# Patient Record
Sex: Female | Born: 1990 | Race: White | Hispanic: No | Marital: Single
Health system: Southern US, Community
[De-identification: ages and names within clinical notes are randomized; demographics above are authoritative.]

---
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
## 2014-05-15 NOTE — H&P (Signed)
L&D Evaluation:  History:   HPI 24 year old WF G1 P[facility]  [facility] 7wk 5 day ultrasound presents with contractions kept overnight for prolonged rule out and made change from 2cm to 4cm. Notes positive fetal movement, denies leakage of fluid and contractions. [facility] in the first trimester. PNC remarkable for hx of anxiety/depression and bulemia and is taking Zoloft 50 mgm daily.    Presents with contractions    Patient's Medical History anxiety/depression, bulemia    Patient's Surgical History none    Medications Pre Natal Vitamins  Zoloft    Allergies NKDA    Social History none    Family History Non-Contributory   ROS:   ROS All systems were reviewed.  HEENT, CNS, GI, GU, Respiratory, CV, Renal and Musculoskeletal systems were found to be normal.   Exam:   Vital Signs stable    Urine Protein not completed    General no apparent distress    Mental Status clear    Chest no increased work of breathing    Abdomen gravid, tender with contractions    Estimated Fetal Weight Average for gestational age, 7lbs    Fetal Position vtx    Back no CVAT    Edema no edema    Pelvic 2 to 4 cm per nursing staff    FHT normal rate with no decels    Ucx regular   Impression:   Impression active labor   Plan:   Plan EFM/NST, monitor contractions and for cervical change    Comments GBS negative admit for term labor may receive epidural when ready   Electronic Signatures: Lorrene ReidStaebler, Tiena Manansala M (MD)  (Signed 13-Jul-13 10:19)  Authored: L&D Evaluation   Last Updated: 13-Jul-13 10:19 by Lorrene ReidStaebler, Kennard Fildes M (MD)

## 2014-05-15 NOTE — H&P (Signed)
L&D Evaluation:  History:   HPI 24 year old WF G1 P0 with EDC=07/22/2011 by  a 7wk 5 day ultrasound presents at 7596w0d with vaginal bleeding. Notes positive fetal movement, denies leakage of fluid and contractions. [facility] first [facility]. PNC remarkable for hx of anxiety/depression and bulemia and is taking Zoloft 50 mgm daily. She was seen in clinic today by Sonda Primesoleen Gutierrez.  Her cervix was checked and once she left clinic she began having vaginal bleeding that is more than she would expect.  She called clinic and was told to come to clinic. She has no abdominal pain.    Presents with vaginal bleeding    Patient's Medical History anxiety/depression, bulemia    Patient's Surgical History none    Medications Pre Natal Vitamins  Zoloft    Allergies NKDA    Social History none    Family History Non-Contributory   ROS:   ROS see HPI   Exam:   Vital Signs 136/83    Urine Protein not completed    General no apparent distress    Mental Status clear    Chest clear    Heart normal sinus rhythm    Abdomen gravid, non-tender    Estimated Fetal Weight Average for gestational age    Fetal Position cephalic    Back no CVAT    Edema no edema    Pelvic no external lesions, digital exam in clinic by C Gutierrez 1cm.  Speculum exam reveals active bleeding from what appears to be the cervix.  The blood is cleared away and the bleeding stops.  There was no blood noted upon inspecting vagina initially.  However, bleeding began at the cervix and was fairly heavy for a couple of minutes.  It then slowed and stopped.    Mebranes Intact    FHT normal rate with no decels, 130/mod var/+accels/no decels    Fetal Heart Rate 130    Ucx absent    Skin dry   Impression:   Impression reactive NST, vaginal bleeding, most likely due to cervical lesion.   Plan:   Plan EFM/NST, Continue FKCs at home. labor precautions. RTN to office on 3 July for  ROB or prn    Comments Given the timing of bleeding with 1) an exam in clinic and 2) the introduction of a speculum on labor and delivery, and there appears to be one location from the cervix from which the blood is coming, the most likely explanation is a lesion on the cervix that has a large blood supply that is easy to disrupt.   - Will continue fetal monitoring on bed rest and then will have her stand up and walk around a while with a pad on. If no bleeding, will have her go home on strict precautions and pelvic rest.  She lives very close to the hospital.  Of note, there is a note from me in her medical record in the clinic where she had some bleeding and came to clinic earlier in her pregnancy. I noted an ectropion on her cervix at that time.   Electronic Signatures for Addendum Section:  Conard NovakJackson, Hanah Moultry D (MD) (Signed Addendum 03-Jul-13 16:28)  Patient has been observed on continuous external fetal monitoring for >1hour. She then walked around for about 20 minutes.  At the end of the 20 minutes there was only 1 or 2 small red dots of blood on pad.  She continues to have no abdominal  pain.  Will discharge on pelvic rest, minimal cervical checks.  She should have a low threshold to return to L&D if bleeding recurs or she has other concerns.  All of her questions were answered.   Electronic Signatures: Conard NovakJackson, Shaida Route D (MD)  (Signed 03-Jul-13 14:59)  Authored: L&D Evaluation   Last Updated: 03-Jul-13 16:28 by Conard NovakJackson, Arius Harnois D (MD)

## 2014-05-15 NOTE — H&P (Signed)
L&D Evaluation:  History:   HPI 24 year old WF G1 P0 with EDC=07/22/2011 by  a 7wk 5 day ultrasound presents at 373/7 weeks with c/o decreased FM since yesterday. Had frequent contractions yesterday, not as frequent today and has had mucous discharge. Denies bleeding or LOF. [facility] in [facility] first trimester. PNC remarkable for hx of anxiety/depression and bulemia and is taking Zoloft 50 mgm daily.    Presents with decreased fetal movement    Patient's Medical History anxiety/depression, bulemia    Patient's Surgical History none    Medications Pre Natal Vitamins  Zoloft    Allergies NKDA    Social History none    Family History Non-Contributory   ROS:   ROS see HPI   Exam:   Vital Signs 134/75    Urine Protein not completed    General no apparent distress    Mental Status clear    Abdomen gravid, non-tender    Estimated Fetal Weight Average for gestational age    Fetal Position cephalic    Pelvic no external lesions, closed/60%/-1    Mebranes Intact    FHT normal rate with no decels, REACTIVE NST-baseline 130s with accels to 170s, with moderate variability    Fetal Heart Rate 135    Ucx absent    Skin dry    Other Patient now feeling  baby moving multiple times   Impression:   Impression Reactive NST-   Plan:   Plan Continue FKCs at home. labor precautions. RTN to office on 3 July for ROB or prn   Electronic Signatures: Trinna BalloonGutierrez, Lavere Stork L (CNM)  (Signed 29-Jun-13 17:09)  Authored: L&D Evaluation   Last Updated: 29-Jun-13 17:09 by Trinna BalloonGutierrez, Zori Benbrook L (CNM)
